# Patient Record
Sex: Male | Born: 2005 | Race: White | Hispanic: No | Marital: Single | Smoking: Never smoker
Health system: Southern US, Community
[De-identification: ages and names within clinical notes are randomized; demographics above are authoritative.]

## PROBLEM LIST (undated history)

## (undated) DIAGNOSIS — F909 Attention-deficit hyperactivity disorder, unspecified type: Secondary | ICD-10-CM

## (undated) HISTORY — PX: TYMPANOSTOMY TUBE PLACEMENT: SHX32

## (undated) HISTORY — PX: ADENOIDECTOMY: SUR15

## (undated) HISTORY — DX: Attention-deficit hyperactivity disorder, unspecified type: F90.9

## (undated) HISTORY — PX: TONSILLECTOMY: SUR1361

---
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
## 2023-04-05 ENCOUNTER — Telehealth: Payer: Self-pay

## 2023-04-05 NOTE — Telephone Encounter (Signed)
LVM for patient to call back [phone], or to call PCP office to schedule physical apt. AS, CMA  

## 2024-07-02 ENCOUNTER — Telehealth: Payer: Self-pay | Admitting: Physician Assistant

## 2024-07-02 NOTE — Telephone Encounter (Signed)
 Sent patient a MyChart message to let them know that they are due for a physical. Advised the patient to give the office a call if and when they would like to schedule an appointment.

## 2024-09-11 ENCOUNTER — Ambulatory Visit: Admitting: Physician Assistant

## 2024-09-11 ENCOUNTER — Encounter: Payer: Self-pay | Admitting: Physician Assistant

## 2024-09-11 VITALS — BP 118/72 | HR 79 | Temp 97.9°F | Ht 70.0 in | Wt 153.0 lb

## 2024-09-11 DIAGNOSIS — F411 Generalized anxiety disorder: Secondary | ICD-10-CM | POA: Diagnosis not present

## 2024-09-11 MED ORDER — BUSPIRONE HCL 10 MG PO TABS: 10.0000 mg | ORAL_TABLET | Freq: Two times a day (BID) | ORAL | 3 refills | Status: AC

## 2024-09-11 NOTE — Progress Notes (Signed)
 Acute Office Visit  Subjective:    Patient ID: Tony Jordan, male    DOB: [birth year], 18 y.o.   MRN: [id number]  Chief Complaint  Patient presents with   Shortness of breath that comes and goes    HPI: Patient is in today for episodes of SOB.   Discussed the use of AI scribe software for clinical note transcription with the patient, who gave verbal consent to proceed.  History of Present Illness Tony Jordan is a 18 year old male who presents with shortness of breath.  He has been experiencing shortness of breath for the past month, occurring randomly but notably around 10 PM and 5 PM. There are no specific triggers, and it does not wake him from sleep, although it sometimes makes it difficult to fall asleep. Focusing on other things can alleviate the symptoms, and yawning sometimes helps. The shortness of breath occurs daily, with occasional chest pain when it is severe. He denies a history of asthma. No heart palpitations, leg pain, or significant changes in breathing with cold exposure or physical activity.  He has been experiencing headaches, notably yesterday morning and this morning. He denies any recent injuries or falls.  He has a history of acid reflux since infancy, with frequent heartburn, especially after consuming red sauce or spicy foods. In the past month, he has vomited twice, possibly related to the reflux, and notes increased nausea with worsening breathing.  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 He describes difficulty sleeping due to a racing mind and has noticed increased anxiety over the past few weeks. He reports feeling anxious and having trouble concentrating several days in the past two weeks, with poor appetite and feeling tired. He acknowledges feeling anxious and having a tendency to worry about various aspects of his life.    Past Medical History:   Diagnosis Date   ADHD     Past Surgical History:  Procedure Laterality Date   ADENOIDECTOMY     TONSILLECTOMY     TYMPANOSTOMY TUBE PLACEMENT      History reviewed. No pertinent family history.  Social History   Socioeconomic History   Marital status: Single    Spouse name: Not on file   Number of children: Not on file   Years of education: Not on file   Highest education level: Not on file  Occupational History   Not on file  Tobacco Use   Smoking status: Never   Smokeless tobacco: Never  Substance and Sexual Activity   Alcohol use: Never   Drug use: Never   Sexual activity: Not on file  Other Topics Concern   Not on file  Social History Narrative   Not on file   Social Drivers of Health   Tobacco Use: Low Risk (09/11/2024)   Patient History    Smoking Tobacco Use: Never    Smokeless Tobacco Use: Never    Passive Exposure: Not on file  Financial Resource Strain: Not on file  Food Insecurity: Not on file  Transportation Needs: Not on file  Physical Activity: Not on file  Stress: Not on file  Social Connections: Not on file  Intimate Partner Violence: Not on file  Depression (PHQ2-9): High Risk (09/11/2024)   Depression (PHQ2-9)    PHQ-2 Score: 12  Alcohol Screen: Not on file  Housing: Not on file  Utilities: Not on file  Health Literacy:  Not on file    No outpatient medications prior to visit.   No facility-administered medications prior to visit.    Allergies[1]  Review of Systems  Constitutional:  Negative for appetite change, fatigue and fever.  HENT:  Negative for congestion, ear pain, sinus pressure and sore throat.   Respiratory:  Positive for shortness of breath (That comes and goes). Negative for cough, chest tightness and wheezing.   Cardiovascular:  Negative for chest pain and palpitations.  Gastrointestinal:  Negative for abdominal pain, constipation, diarrhea, nausea and vomiting.  Genitourinary:  Negative for dysuria and hematuria.   Musculoskeletal:  Negative for arthralgias, back pain, joint swelling and myalgias.  Skin:  Negative for rash.  Neurological:  Negative for dizziness, weakness and headaches.  Psychiatric/Behavioral:  Negative for dysphoric mood. The patient is not nervous/anxious.        Objective:        09/11/2024   10:46 AM 11/15/2022    1:34 PM 11/17/2021    8:22 AM  Vitals with BMI  Height 5' 10 5' 10 5' 10  Weight 153 lbs 153 lbs 3 oz 138 lbs  BMI 21.95 21.98 19.8  Systolic 118 110 881  Diastolic 72 70 76  Pulse 79 104 64    Orthostatic VS for the past 72 hrs (Last 3 readings):  Patient Position BP Location  09/11/24 1046 Sitting Right Arm     Physical Exam Vitals reviewed.  Constitutional:      Appearance: Normal appearance.  Cardiovascular:     Rate and Rhythm: Normal rate and regular rhythm.     Heart sounds: Normal heart sounds.  Pulmonary:     Effort: Pulmonary effort is normal.     Breath sounds: Normal breath sounds.  Abdominal:     General: Bowel sounds are normal.     Palpations: Abdomen is soft.     Tenderness: There is no abdominal tenderness.  Neurological:     Mental Status: He is alert and oriented to person, place, and time.  Psychiatric:        Mood and Affect: Mood normal.        Behavior: Behavior normal.     Health Maintenance Due  Topic Date Due   HPV VACCINES (1 - Male 3-dose series) Never done   HIV Screening  Never done   Meningococcal B Vaccine (1 of 2 - Standard) Never done   Hepatitis C Screening  Never done   DTaP/Tdap/Td (1 - Tdap) Never done   Hepatitis B Vaccines 19-59 Average Risk (1 of 3 - 19+ 3-dose series) Never done   Influenza Vaccine  Never done   COVID-19 Vaccine (1 - 2025-26 season) Never done       Topic Date Due   HPV VACCINES (1 - Male 3-dose series) Never done   Hepatitis B Vaccines 19-59 Average Risk (1 of 3 - 19+ 3-dose series) Never done     No results found for: TSH Lab Results  Component Value Date    WBC 8.2 11/22/2019   HGB 15.5 11/22/2019   HCT 44.1 11/22/2019   MCV 87 11/22/2019   PLT 264 11/22/2019   No results found for: NA, K, CHLORIDE, CO2, GLUCOSE, BUN, CREATININE, BILITOT, ALKPHOS, AST, ALT, PROT, ALBUMIN, CALCIUM, ANIONGAP, EGFR, GFR No results found for: CHOL No results found for: HDL No results found for: LDLCALC No results found for: TRIG No results found for: CHOLHDL No results found for: YHAJ8R      Results for  orders placed or performed in visit on 11/15/22  POCT rapid strep A   Collection Time: 11/15/22  1:42 PM  Result Value Ref Range   Rapid Strep A Screen Positive (A) Negative  POCT Influenza A/B   Collection Time: 11/15/22  1:42 PM  Result Value Ref Range   Influenza A, POC Negative Negative   Influenza B, POC Negative Negative  POC COVID-19 BinaxNow   Collection Time: 11/15/22  1:43 PM  Result Value Ref Range   SARS Coronavirus 2 Ag Negative Negative     Assessment & Plan:   Assessment & Plan GAD (generalized anxiety disorder) Symptoms of shortness of breath, chest tightness, and insomnia likely due to anxiety. No evidence of asthma or lung issues. Anxiety identified as primary cause. - Prescribed Buspar , start once daily in the morning for one week, then increase to twice daily. - Discussed potential side effects of Buspar , including energy boost and headaches. - Advised to monitor symptoms and report any worsening or new symptoms. - Discussed counseling as adjunct to medication. - Scheduled follow-up in one month to assess medication response and adjust treatment. Orders:   busPIRone  (BUSPAR ) 10 MG tablet; Take 1 tablet (10 mg total) by mouth 2 (two) times daily.    Body mass index is 21.95 kg/m..  Meds ordered this encounter  Medications   busPIRone  (BUSPAR ) 10 MG tablet    Sig: Take 1 tablet (10 mg total) by mouth 2 (two) times daily.    Dispense:  60 tablet    Refill:  3   No orders of  the defined types were placed in this encounter.    Follow-up: Return in about 4 weeks (around 10/09/2024) for Chronic.  An After Visit Summary was printed and given to the patient.    I,Lauren M Auman,acting as a neurosurgeon for [employer]have documented all relevant documentation on the behalf of Nola Angles, PA,as directed by  Nola Angles, PA while in the presence of Nola Angles, Tony Jordan.   Nola Angles, Tony Jordan [facility] [phone]     [1]  Allergies Allergen Reactions   Penicillin G Rash

## 2024-10-11 ENCOUNTER — Ambulatory Visit: Admitting: Physician Assistant
